# Patient Record
Sex: Male | Born: 1990 | Race: Black or African American | Hispanic: No | Marital: Single | Smoking: Current every day smoker
Health system: Southern US, Community
[De-identification: ages and names within clinical notes are randomized; demographics above are authoritative.]

## PROBLEM LIST (undated history)

## (undated) HISTORY — PX: SHOULDER SURGERY: SHX246

---
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
## 2009-07-10 ENCOUNTER — Emergency Department (HOSPITAL_COMMUNITY): Admission: EM | Admit: 2009-07-10 | Discharge: 2009-07-10 | Payer: Self-pay | Admitting: Emergency Medicine

## 2011-09-25 ENCOUNTER — Encounter (HOSPITAL_COMMUNITY): Payer: Self-pay | Admitting: Emergency Medicine

## 2011-09-25 ENCOUNTER — Emergency Department (HOSPITAL_COMMUNITY)
Admission: EM | Admit: 2011-09-25 | Discharge: 2011-09-25 | Disposition: A | Discharge status: Home Health | Payer: 59 | Attending: Emergency Medicine | Admitting: Emergency Medicine

## 2011-09-25 ENCOUNTER — Emergency Department (HOSPITAL_COMMUNITY): Payer: 59

## 2011-09-25 DIAGNOSIS — R109 Unspecified abdominal pain: Secondary | ICD-10-CM | POA: Insufficient documentation

## 2011-09-25 DIAGNOSIS — T148XXA Other injury of unspecified body region, initial encounter: Secondary | ICD-10-CM

## 2011-09-25 DIAGNOSIS — IMO0002 Reserved for concepts with insufficient information to code with codable children: Secondary | ICD-10-CM | POA: Insufficient documentation

## 2011-09-25 DIAGNOSIS — R220 Localized swelling, mass and lump, head: Secondary | ICD-10-CM | POA: Insufficient documentation

## 2011-09-25 DIAGNOSIS — F17200 Nicotine dependence, unspecified, uncomplicated: Secondary | ICD-10-CM | POA: Insufficient documentation

## 2011-09-25 DIAGNOSIS — S060X9A Concussion with loss of consciousness of unspecified duration, initial encounter: Secondary | ICD-10-CM | POA: Insufficient documentation

## 2011-09-25 MED ORDER — ONDANSETRON HCL 4 MG PO TABS: 4.0000 mg | ORAL_TABLET | Freq: Four times a day (QID) | ORAL | Status: AC

## 2011-09-25 MED ORDER — NAPROXEN 250 MG PO TABS
500.0000 mg | ORAL_TABLET | ORAL | Status: DC
  Filled 2011-09-25: qty 2

## 2011-09-25 MED ORDER — TETANUS-DIPHTH-ACELL PERTUSSIS 5-2.5-18.5 LF-MCG/0.5 IM SUSP
0.5000 mL | Freq: Once | INTRAMUSCULAR | Status: AC
  Administered 2011-09-25: 0.5 mL via INTRAMUSCULAR
  Filled 2011-09-25 (×2): qty 0.5

## 2011-09-25 MED ORDER — ONDANSETRON 4 MG PO TBDP
8.0000 mg | ORAL_TABLET | Freq: Once | ORAL | Status: AC
  Administered 2011-09-25: 8 mg via ORAL
  Filled 2011-09-25: qty 2

## 2011-09-25 MED ORDER — NAPROXEN 500 MG PO TABS: 500.0000 mg | ORAL_TABLET | Freq: Two times a day (BID) | ORAL | Status: AC
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
## 2011-09-25 NOTE — Discharge Instructions (Signed)
Concussion and Brain Injury A blow or jolt to the head can disrupt the normal function of the brain. This type of brain injury is often called a "concussion" or a "closed head injury." Concussions are usually not life-threatening. Even so, the effects of a concussion can be serious.  CAUSES  A concussion is caused by a blunt blow to the head. The blow might be direct or indirect as described below.  Direct blow (running into another player during a soccer game, being hit in a fight, or hitting your head on a hard surface).   Indirect blow (when your head moves rapidly and violently back and forth like in a car crash).  SYMPTOMS  The brain is very complex. Every head injury is different. Some symptoms may appear right away. Other symptoms may not show up for days or weeks after the concussion. The signs of concussion can be hard to notice. Early on, problems may be missed by patients, family members, and caregivers. You may look fine even though you are acting or feeling differently.  These symptoms are usually temporary, but may last for days, weeks, or even longer. Symptoms include:  Mild headaches that will not go away.   Having more trouble than usual with:   Remembering things.   Paying attention or concentrating.   Organizing daily tasks.   Making decisions and solving problems.   Slowness in thinking, acting, speaking, or reading.   Getting lost or easily confused.   Feeling tired all the time or lacking energy (fatigue).   Feeling drowsy.   Sleep disturbances.   Sleeping more than usual.   Sleeping less than usual.   Trouble falling asleep.   Trouble sleeping (insomnia).   Loss of balance or feeling lightheaded or dizzy.   Nausea or vomiting.   Numbness or tingling.   Increased sensitivity to:   Sounds.   Lights.   Distractions.  Other symptoms might include:  Vision problems or eyes that tire easily.   Diminished sense of taste or smell.   Ringing  in the ears.   Mood changes such as feeling sad, anxious, or listless.   Becoming easily irritated or angry for little or no reason.   Lack of motivation.  DIAGNOSIS  Your caregiver can usually diagnose a concussion or mild brain injury based on your description of your injury and your symptoms.  Your evaluation might include:  A brain scan to look for signs of injury to the brain. Even if the test shows no injury, you may still have a concussion.   Blood tests to be sure other problems are not present.  TREATMENT   People with a concussion need to be examined and evaluated. Most people with concussions are treated in an emergency department, urgent care, or clinic. Some people must stay in the hospital overnight for further treatment.   Your caregiver will send you home with important instructions to follow. Be sure to carefully follow them.   Tell your caregiver if you are already taking any medicines (prescription, over-the-counter, or natural remedies), or if you are drinking alcohol or taking illegal drugs. Also, talk with your caregiver if you are taking blood thinners (anticoagulants) or aspirin. These drugs may increase your chances of complications. All of this is important information that may affect treatment.   Only take over-the-counter or prescription medicines for pain, discomfort, or fever as directed by your caregiver.  PROGNOSIS  How fast people recover from brain injury varies from person to person.   Although most people have a good recovery, how quickly they improve depends on many factors. These factors include how severe their concussion was, what part of the brain was injured, their age, and how healthy they were before the concussion.  Because all head injuries are different, so is recovery. Most people with mild injuries recover fully. Recovery can take time. In general, recovery is slower in older persons. Also, persons who have had a concussion in the past or have  other medical problems may find that it takes longer to recover from their current injury. Anxiety and depression may also make it harder to adjust to the symptoms of brain injury. HOME CARE INSTRUCTIONS  Return to your normal activities slowly, not all at once. You must give your body and brain enough time for recovery.  Get plenty of sleep at night, and rest during the day. Rest helps the brain to heal.   Avoid staying up late at night.   Keep the same bedtime hours on weekends and weekdays.   Take daytime naps or rest breaks when you feel tired.   Limit activities that require a lot of thought or concentration (brain or cognitive rest). This includes:   Homework or job-related work.   Watching TV.   Computer work.   Avoid activities that could lead to a second brain injury, such as contact or recreational sports, until your caregiver says it is okay. Even after your brain injury has healed, you should protect yourself from having another concussion.   Ask your caregiver when you can return to your normal activities such as driving, bicycling, or operating heavy equipment. Your ability to react may be slower after a brain injury.   Talk with your caregiver about when you can return to work or school.   Inform your teachers, school nurse, school counselor, coach, athletic trainer, or work manager about your injury, symptoms, and restrictions. They should be instructed to report:   Increased problems with attention or concentration.   Increased problems remembering or learning new information.   Increased time needed to complete tasks or assignments.   Increased irritability or decreased ability to cope with stress.   Increased symptoms.   Take only those medicines that your caregiver has approved.   Do not drink alcohol until your caregiver says you are well enough to do so. Alcohol and certain other drugs may slow your recovery and can put you at risk of further injury.    If it is harder than usual to remember things, write them down.   If you are easily distracted, try to do one thing at a time. For example, do not try to watch TV while fixing dinner.   Talk with family members or close friends when making important decisions.   Keep all follow-up appointments. Repeated evaluation of your symptoms is recommended for your recovery.  PREVENTION  Protect your head from future injury. It is very important to avoid another head or brain injury before you have recovered. In rare cases, another injury has lead to permanent brain damage, brain swelling, or death. Avoid injuries by using:  Seatbelts when riding in a car.   Alcohol only in moderation.   A helmet when biking, skiing, skateboarding, skating, or doing similar activities.   Safety measures in your home.   Remove clutter and tripping hazards from floors and stairways.   Use grab bars in bathrooms and handrails by stairs.   Place non-slip mats on floors and in bathtubs.     Improve lighting in dim areas.  SEEK MEDICAL CARE IF:  A head injury can cause lingering symptoms. You should seek medical care if you have any of the following symptoms for more than 3 weeks after your injury or are planning to return to sports:  Chronic headaches.   Dizziness or balance problems.   Nausea.   Vision problems.   Increased sensitivity to noise or light.   Depression or mood swings.   Anxiety or irritability.   Memory problems.   Difficulty concentrating or paying attention.   Sleep problems.   Feeling tired all the time.  SEEK IMMEDIATE MEDICAL CARE IF:  You have had a blow or jolt to the head and you (or your family or friends) notice:  Severe or worsening headaches.   Weakness (even if only in one hand or one leg or one part of the face), numbness, or decreased coordination.   Repeated vomiting.   Increased sleepiness or passing out.   One black center of the eye (pupil) is larger  than the other.   Convulsions (seizures).   Slurred speech.   Increasing confusion, restlessness, agitation, or irritability.   Lack of ability to recognize people or places.   Neck pain.   Difficulty being awakened.   Unusual behavior changes.   Loss of consciousness.  Older adults with a brain injury may have a higher risk of serious complications such as a blood clot on the brain. Headaches that get worse or an increase in confusion are signs of this complication. If these signs occur, see a caregiver right away. MAKE SURE YOU:   Understand these instructions.   Will watch your condition.   Will get help right away if you are not doing well or get worse.  FOR MORE INFORMATION  Several groups help people with brain injury and their families. They provide information and put people in touch with local resources. These include support groups, rehabilitation services, and a variety of health care professionals. Among these groups, the Brain Injury Association (BIA, www.biausa.org) has a national office that gathers scientific and educational information and works on a national level to help people with brain injury.  Document Released: 09/26/2003 Document Revised: 06/25/2011 Document Reviewed: 02/22/2008 Exit[employer] Patient Information 2012 [employer]. 

## 2011-09-25 NOTE — ED Notes (Signed)
Patient refused medication, sts it is only ibuprofen.
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
## 2013-02-12 ENCOUNTER — Emergency Department (HOSPITAL_COMMUNITY): Payer: 59

## 2013-02-12 ENCOUNTER — Observation Stay (HOSPITAL_COMMUNITY)
Admission: EM | Admit: 2013-02-12 | Discharge: 2013-02-13 | Disposition: A | Discharge status: Home / Self Care | Payer: 59 | Attending: General Surgery | Admitting: General Surgery

## 2013-02-12 ENCOUNTER — Encounter (HOSPITAL_COMMUNITY): Payer: Self-pay | Admitting: *Deleted

## 2013-02-12 DIAGNOSIS — S0081XA Abrasion of other part of head, initial encounter: Secondary | ICD-10-CM

## 2013-02-12 DIAGNOSIS — S060X9A Concussion with loss of consciousness of unspecified duration, initial encounter: Secondary | ICD-10-CM

## 2013-02-12 DIAGNOSIS — S42009A Fracture of unspecified part of unspecified clavicle, initial encounter for closed fracture: Secondary | ICD-10-CM | POA: Insufficient documentation

## 2013-02-12 DIAGNOSIS — S42002A Fracture of unspecified part of left clavicle, initial encounter for closed fracture: Secondary | ICD-10-CM

## 2013-02-12 DIAGNOSIS — IMO0002 Reserved for concepts with insufficient information to code with codable children: Secondary | ICD-10-CM | POA: Insufficient documentation

## 2013-02-12 DIAGNOSIS — S060X0A Concussion without loss of consciousness, initial encounter: Principal | ICD-10-CM | POA: Insufficient documentation

## 2013-02-12 LAB — POCT I-STAT, CHEM 8
BUN: 10 mg/dL (ref 6–23)
Calcium, Ion: 1.18 mmol/L (ref 1.12–1.23)
Chloride: 99 mEq/L (ref 96–112)
Creatinine, Ser: 1.2 mg/dL (ref 0.50–1.35)
Glucose, Bld: 132 mg/dL — ABNORMAL HIGH (ref 70–99)
HCT: 49 % (ref 39.0–52.0)
Hemoglobin: 16.7 g/dL (ref 13.0–17.0)
Potassium: 3.6 mEq/L (ref 3.5–5.1)
Sodium: 139 mEq/L (ref 135–145)
TCO2: 27 mmol/L (ref 0–100)

## 2013-02-12 LAB — BASIC METABOLIC PANEL WITH GFR
BUN: 10 mg/dL (ref 6–23)
Chloride: 99 meq/L (ref 96–112)
Creatinine, Ser: 1.13 mg/dL (ref 0.50–1.35)
Glucose, Bld: 130 mg/dL — ABNORMAL HIGH (ref 70–99)
Potassium: 3.6 meq/L (ref 3.5–5.1)

## 2013-02-12 LAB — PROTIME-INR
INR: 1.05 (ref 0.00–1.49)
Prothrombin Time: 13.5 seconds (ref 11.6–15.2)

## 2013-02-12 LAB — BASIC METABOLIC PANEL
CO2: 31 mEq/L (ref 19–32)
Calcium: 9.8 mg/dL (ref 8.4–10.5)
GFR calc Af Amer: >90 mL/min (ref >90)
GFR calc non Af Amer: >90 mL/min (ref >90)
Sodium: 137 mEq/L (ref 135–145)

## 2013-02-12 LAB — CBC WITH DIFFERENTIAL/PLATELET
Basophils Absolute: 0 10*3/uL (ref 0.0–0.1)
Basophils Relative: 0 % (ref 0–1)
Eosinophils Absolute: 0.2 10*3/uL (ref 0.0–0.7)
Eosinophils Relative: 2 % (ref 0–5)
HCT: 43.8 % (ref 39.0–52.0)
Hemoglobin: 15.2 g/dL (ref 13.0–17.0)
Lymphocytes Relative: 16 % (ref 12–46)
Lymphs Abs: 2.1 K/uL (ref 0.7–4.0)
MCH: 28.4 pg (ref 26.0–34.0)
MCHC: 34.7 g/dL (ref 30.0–36.0)
MCV: 81.7 fL (ref 78.0–100.0)
Monocytes Absolute: 0.7 K/uL (ref 0.1–1.0)
Monocytes Relative: 5 % (ref 3–12)
Neutro Abs: 10.5 K/uL — ABNORMAL HIGH (ref 1.7–7.7)
Neutrophils Relative %: 78 % — ABNORMAL HIGH (ref 43–77)
Platelets: 206 10*3/uL (ref 150–400)
RBC: 5.36 MIL/uL (ref 4.22–5.81)
RDW: 13.4 % (ref 11.5–15.5)
WBC: 13.6 10*3/uL — ABNORMAL HIGH (ref 4.0–10.5)

## 2013-02-12 LAB — RAPID URINE DRUG SCREEN, HOSP PERFORMED
Amphetamines: NOT DETECTED
Barbiturates: NOT DETECTED
Benzodiazepines: NOT DETECTED
Cocaine: NOT DETECTED
Opiates: NOT DETECTED
Tetrahydrocannabinol: NOT DETECTED

## 2013-02-12 LAB — ETHANOL: Alcohol, Ethyl (B): <11 mg/dL (ref 0–11)

## 2013-02-12 LAB — APTT: aPTT: 25 seconds (ref 24–37)

## 2013-02-12 MED ORDER — MORPHINE SULFATE 2 MG/ML IJ SOLN
2.0000 mg | Freq: Once | INTRAMUSCULAR | Status: AC
  Administered 2013-02-12: 2 mg via INTRAVENOUS
  Filled 2013-02-12: qty 1

## 2013-02-12 MED ORDER — ONDANSETRON HCL 4 MG/2ML IJ SOLN: 4.0000 mg | Freq: Three times a day (TID) | INTRAMUSCULAR | Status: DC | PRN

## 2013-02-12 MED ORDER — HYDROCODONE-ACETAMINOPHEN 5-325 MG PO TABS: 1.0000 | ORAL_TABLET | ORAL | Status: DC | PRN

## 2013-02-12 MED ORDER — SODIUM CHLORIDE 0.9 % IV BOLUS (SEPSIS)
1000.0000 mL | Freq: Once | INTRAVENOUS | Status: AC
  Administered 2013-02-12: 1000 mL via INTRAVENOUS
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
## 2013-02-12 NOTE — ED Notes (Signed)
Pt offered pain medication, pt states that he does not need medication currently will inform RN (me) if his pain increases.

## 2013-02-12 NOTE — ED Notes (Signed)
Pt provided urine sample prior to medication administration.
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
## 2013-02-12 NOTE — ED Notes (Signed)
Family at beside. Family given emotional support. 

## 2013-02-12 NOTE — ED Notes (Addendum)
Resident at bedside discussing plan of care with pt and family. NT at bedside performing wound care to abrasion to left upper lip, cleaned bacitracin applied.
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
## 2013-02-13 ENCOUNTER — Encounter (HOSPITAL_COMMUNITY): Payer: Self-pay | Admitting: Radiology

## 2013-02-13 ENCOUNTER — Encounter (INDEPENDENT_AMBULATORY_CARE_PROVIDER_SITE_OTHER): Payer: Self-pay | Admitting: Orthopedic Surgery

## 2013-02-13 ENCOUNTER — Observation Stay (HOSPITAL_COMMUNITY): Payer: 59

## 2013-02-13 DIAGNOSIS — S42009A Fracture of unspecified part of unspecified clavicle, initial encounter for closed fracture: Secondary | ICD-10-CM

## 2013-02-13 DIAGNOSIS — S060XAA Concussion with loss of consciousness status unknown, initial encounter: Secondary | ICD-10-CM

## 2013-02-13 DIAGNOSIS — S060X9A Concussion with loss of consciousness of unspecified duration, initial encounter: Secondary | ICD-10-CM

## 2013-02-13 LAB — BASIC METABOLIC PANEL
BUN: 10 mg/dL (ref 6–23)
CO2: 25 mEq/L (ref 19–32)
Chloride: 106 mEq/L (ref 96–112)
Glucose, Bld: 83 mg/dL (ref 70–99)
Potassium: 3.5 mEq/L (ref 3.5–5.1)

## 2013-02-13 LAB — CBC
HCT: 40.1 % (ref 39.0–52.0)
Hemoglobin: 13.7 g/dL (ref 13.0–17.0)
RBC: 4.87 MIL/uL (ref 4.22–5.81)
WBC: 11.5 10*3/uL — ABNORMAL HIGH (ref 4.0–10.5)

## 2013-02-13 MED ORDER — ONDANSETRON HCL 4 MG/2ML IJ SOLN: 4.0000 mg | Freq: Four times a day (QID) | INTRAMUSCULAR | Status: DC | PRN

## 2013-02-13 MED ORDER — MORPHINE SULFATE 2 MG/ML IJ SOLN
2.0000 mg | INTRAMUSCULAR | Status: DC | PRN
  Administered 2013-02-13: 2 mg via INTRAVENOUS
  Filled 2013-02-13: qty 1

## 2013-02-13 MED ORDER — ONDANSETRON HCL 4 MG/2ML IJ SOLN: 4.0000 mg | INTRAMUSCULAR | Status: DC | PRN

## 2013-02-13 MED ORDER — IOHEXOL 300 MG/ML  SOLN
100.0000 mL | Freq: Once | INTRAMUSCULAR | Status: AC | PRN
  Administered 2013-02-13: 100 mL via INTRAVENOUS

## 2013-02-13 MED ORDER — SODIUM CHLORIDE 0.9 % IV SOLN
INTRAVENOUS | Status: DC
  Administered 2013-02-13: 01:00:00 via INTRAVENOUS

## 2013-02-13 MED ORDER — MORPHINE SULFATE 2 MG/ML IJ SOLN
2.0000 mg | INTRAMUSCULAR | Status: DC | PRN
  Administered 2013-02-13 (×3): 4 mg via INTRAVENOUS
  Filled 2013-02-13 (×3): qty 2

## 2013-02-13 MED ORDER — WHITE PETROLATUM GEL
Status: AC
  Administered 2013-02-13: 05:00:00
  Filled 2013-02-13: qty 5

## 2013-02-13 MED ORDER — ONDANSETRON HCL 4 MG PO TABS: 4.0000 mg | ORAL_TABLET | Freq: Four times a day (QID) | ORAL | Status: DC | PRN

## 2013-02-13 MED ORDER — OXYCODONE HCL 5 MG PO TABS
5.0000 mg | ORAL_TABLET | ORAL | Status: DC | PRN
  Administered 2013-02-13: 10 mg via ORAL
  Administered 2013-02-13: 5 mg via ORAL
  Filled 2013-02-13: qty 1
  Filled 2013-02-13: qty 2

## 2013-02-13 MED ORDER — BACITRACIN ZINC 500 UNIT/GM EX OINT
1.0000 "application " | TOPICAL_OINTMENT | Freq: Two times a day (BID) | CUTANEOUS | Status: DC
  Filled 2013-02-13: qty 28.35

## 2013-02-13 MED ORDER — OXYCODONE-ACETAMINOPHEN 5-325 MG PO TABS: 1.0000 | ORAL_TABLET | ORAL | Status: DC | PRN

## 2013-02-13 NOTE — Plan of Care (Signed)
Problem: Phase II Progression Outcomes Goal: Discharge plan established Recommend outpatient OT when appropriate per MD
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
## 2013-02-13 NOTE — Progress Notes (Signed)
This patient has been seen and I agree with the findings and treatment plan.  Dominic Petersen, III, MD, FACS [phone] (pager) [phone] (direct pager) Trauma Surgeon  
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
## 2013-02-13 NOTE — Progress Notes (Signed)
Pt c/o lower abdominal cramping upon arrival to floor. MD notified. Order received for CT abdomen. Will continue to monitor.
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
## 2013-09-02 ENCOUNTER — Encounter (HOSPITAL_COMMUNITY): Payer: Self-pay | Admitting: Emergency Medicine

## 2013-09-02 ENCOUNTER — Emergency Department (HOSPITAL_COMMUNITY)
Admission: EM | Admit: 2013-09-02 | Discharge: 2013-09-02 | Disposition: A | Discharge status: Home / Self Care | Payer: 59 | Source: Home / Self Care | Attending: Family Medicine | Admitting: Family Medicine

## 2013-09-02 DIAGNOSIS — J111 Influenza due to unidentified influenza virus with other respiratory manifestations: Secondary | ICD-10-CM

## 2013-09-02 DIAGNOSIS — R69 Illness, unspecified: Principal | ICD-10-CM

## 2013-09-02 MED ORDER — OSELTAMIVIR PHOSPHATE 75 MG PO CAPS: 75.0000 mg | ORAL_CAPSULE | Freq: Two times a day (BID) | ORAL | Status: DC

## 2013-09-02 MED ORDER — DEXTROMETHORPHAN POLISTIREX 30 MG/5ML PO LQCR: 60.0000 mg | Freq: Two times a day (BID) | ORAL | Status: DC

## 2013-09-02 NOTE — ED Provider Notes (Signed)
CSN: [id number]     Arrival date & time 09/02/13  1222 History   First MD Initiated Contact with Patient 09/02/13 1301     Chief Complaint  Patient presents with  . URI     (Consider location/radiation/quality/duration/timing/severity/associated sxs/prior Treatment) Patient is a 23 y.o. male presenting with URI. The history is provided by the patient.  URI Presenting symptoms: congestion, cough, fatigue, fever and rhinorrhea   Severity:  Moderate Onset quality:  Sudden Duration:  1 day Progression:  Unchanged Chronicity:  New Associated symptoms: myalgias   Risk factors: sick contacts     History reviewed. No pertinent past medical history. Past Surgical History  Procedure Laterality Date  . Shoulder surgery     History reviewed. No pertinent family history. History  Substance Use Topics  . Smoking status: Current Every Day Smoker  . Smokeless tobacco: Not on file  . Alcohol Use: Yes    Review of Systems  Constitutional: Positive for fever, chills, appetite change and fatigue.  HENT: Positive for congestion, postnasal drip and rhinorrhea.   Respiratory: Positive for cough.   Cardiovascular: Negative.   Gastrointestinal: Negative.   Musculoskeletal: Positive for myalgias.      Allergies  Review of patient's allergies indicates no known allergies.  Home Medications   Current Outpatient Rx  Name  Route  Sig  Dispense  Refill  . dextromethorphan (DELSYM) 30 MG/5ML liquid   Oral   Take 10 mLs (60 mg total) by mouth 2 (two) times daily. Prn cough   89 mL   1   . oseltamivir (TAMIFLU) 75 MG capsule   Oral   Take 1 capsule (75 mg total) by mouth every 12 (twelve) hours. Take all of medication.   10 capsule   0   . oxyCODONE-acetaminophen (ROXICET) 5-325 MG per tablet   Oral   Take 1-2 tablets by mouth every 4 (four) hours as needed for pain.   60 tablet   0    BP 125/74  Pulse 78  Temp(Src) 97.8 F (36.6 C) (Oral)  Resp 18  SpO2 98% Physical Exam   Nursing note and vitals reviewed. Constitutional: He is oriented to person, place, and time. He appears well-developed and well-nourished. No distress.  HENT:  Head: Normocephalic.  Right Ear: External ear normal.  Left Ear: External ear normal.  Nose: Mucosal edema and rhinorrhea present.  Mouth/Throat: Oropharynx is clear and moist. No oropharyngeal exudate.  Neck: Normal range of motion. Neck supple.  Cardiovascular: Regular rhythm.   Pulmonary/Chest: Effort normal and breath sounds normal.  Abdominal: Soft. Bowel sounds are normal.  Lymphadenopathy:    He has no cervical adenopathy.  Neurological: He is alert and oriented to person, place, and time.  Skin: Skin is warm and dry.    ED Course  Procedures (including critical care time) Labs Review Labs Reviewed - No data to display Imaging Review No results found.    MDM   Final diagnoses:  Influenza-like illness        Linna HoffJames D Dakari Cregger, MD 09/02/13 [phone]

## 2013-09-02 NOTE — Discharge Instructions (Signed)
Drink plenty of fluids as discussed, use medicine as prescribed, and mucinex or delsym for cough. No more smoking while sick.  Return or see your doctor if further problems

## 2013-09-02 NOTE — ED Notes (Signed)
Reported general body aches, fatigue, lethargy since yesterday
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
## 2015-12-19 ENCOUNTER — Encounter (HOSPITAL_COMMUNITY): Payer: Self-pay

## 2015-12-19 ENCOUNTER — Encounter (HOSPITAL_COMMUNITY)
Admission: RE | Admit: 2015-12-19 | Discharge: 2015-12-19 | Disposition: A | Discharge status: Home / Self Care | Payer: 59 | Source: Ambulatory Visit | Attending: Surgery | Admitting: Surgery

## 2015-12-19 DIAGNOSIS — Z01812 Encounter for preprocedural laboratory examination: Secondary | ICD-10-CM | POA: Insufficient documentation

## 2015-12-19 DIAGNOSIS — K4090 Unilateral inguinal hernia, without obstruction or gangrene, not specified as recurrent: Secondary | ICD-10-CM | POA: Insufficient documentation

## 2015-12-19 LAB — CBC
HCT: 45.9 % (ref 39.0–52.0)
Hemoglobin: 15 g/dL (ref 13.0–17.0)
MCH: 26.9 pg (ref 26.0–34.0)
MCHC: 32.7 g/dL (ref 30.0–36.0)
MCV: 82.4 fL (ref 78.0–100.0)
Platelets: 207 10*3/uL (ref 150–400)
RBC: 5.57 MIL/uL (ref 4.22–5.81)
RDW: 13.6 % (ref 11.5–15.5)
WBC: 8.9 10*3/uL (ref 4.0–10.5)

## 2015-12-19 LAB — BASIC METABOLIC PANEL
ANION GAP: 5 (ref 5–15)
BUN: 10 mg/dL (ref 6–20)
CALCIUM: 9.9 mg/dL (ref 8.9–10.3)
CHLORIDE: 103 mmol/L (ref 101–111)
CO2: 30 mmol/L (ref 22–32)
CREATININE: 1.04 mg/dL (ref 0.61–1.24)
GFR calc non Af Amer: >60 mL/min (ref >60)
Glucose, Bld: 99 mg/dL (ref 65–99)
Potassium: 4.6 mmol/L (ref 3.5–5.1)
SODIUM: 138 mmol/L (ref 135–145)

## 2015-12-19 MED ORDER — CHLORHEXIDINE GLUCONATE 4 % EX LIQD: 1.0000 "application " | Freq: Once | CUTANEOUS | Status: DC

## 2015-12-19 NOTE — H&P (Signed)
Dominic Petersen is an 25 y.o. male.   Chief Complaint: LIH HPI: pt presents for repair of LIH.  Pt has pain and bulge in left inguinal region  No past medical history on file.  Past Surgical History  Procedure Laterality Date  . Shoulder surgery      No family history on file. Social History:  reports that he has been smoking.  He does not have any smokeless tobacco history on file. He reports that he drinks alcohol. He reports that he does not use illicit drugs.  Allergies: No Known Allergies  No prescriptions prior to admission    No results found for this or any previous visit (from the past 48 hour(s)). No results found.  Review of Systems  Constitutional: Negative.   Respiratory: Negative.   Cardiovascular: Negative.   Gastrointestinal: Negative.   Genitourinary: Negative.   Musculoskeletal: Negative.     There were no vitals taken for this visit. Physical Exam  Constitutional: He is oriented to person, place, and time. He appears well-developed and well-nourished.  HENT:  Head: Normocephalic.  Eyes: Pupils are equal, round, and reactive to light.  Neck: Normal range of motion.  Cardiovascular: Normal rate and regular rhythm.   Respiratory: Effort normal and breath sounds normal.  GI: A hernia is present. Hernia confirmed positive in the left inguinal area.  Musculoskeletal: Normal range of motion.  Neurological: He is alert and oriented to person, place, and time.  Skin: Skin is warm and dry.     Assessment/Plan Left inguinal hernia reducible   Recommend repair of LIH Discussed options of repair and use of mesh  Open and laparoscopic options discussed  The risk of hernia repair include bleeding,  Infection,   Recurrence of the hernia,  Mesh use, chronic pain,  Organ injury,  Bowel injury,  Bladder injury,   nerve injury with numbness around the incision,  Death,  and worsening of preexisting  medical problems.  The alternatives to surgery have been  discussed as well..  Long term expectations of both operative and non operative treatments have been discussed.   The patient agrees to proceed.  Dominic Petersen A., MD 12/19/2015, 10:03 AM

## 2015-12-19 NOTE — Pre-Procedure Instructions (Signed)
    Despina Poleicholas S Rash  12/19/2015      [facility] [address]- [place], [place] - [address] [place] [address] Phone: [phone] Fax: [phone]    Your procedure is scheduled on December 26, 2015 @ 8:30 AM  [facility] at 6:30 A.M.  Call this number if you have problems the morning of surgery:  [phone]   For questions prior to day of surgery call [phone] 8a-4p.   Remember:  Do not eat food or drink liquids after midnight.  Take these medicines the morning of surgery with A SIP OF WATER : NONE   STOP NSAID'S (IBUPROFEN, ADVIL, ALEVE,NAPROXEN) AS OF TODAY   Do not wear jewelry.  Do not wear lotions, powders, or colognes.  You may wear deodorant.  Men may shave face and neck only.  Do not bring valuables to the hospital.  [facility] is not responsible for any belongings or valuables.  Contacts, dentures or bridgework may not be worn into surgery.  Leave your suitcase in the car.  After surgery it may be brought to your room.  For patients admitted to the hospital, discharge time will be determined by your treatment team.  Patients discharged the day of surgery will not be allowed to drive home.   Name and phone number of your driver:    Special instructions:  "Preparing for Surgery"  Please read over the following fact sheets that you were given. Pain Booklet, Coughing and Deep Breathing and Surgical Site Infection Prevention

## 2015-12-25 MED ORDER — DEXTROSE 5 % IV SOLN
3.0000 g | INTRAVENOUS | Status: AC
  Administered 2015-12-26: 3 g via INTRAVENOUS
  Filled 2015-12-25: qty 3000

## 2015-12-25 NOTE — Anesthesia Preprocedure Evaluation (Signed)
Anesthesia Evaluation  Patient identified by MRN, date of birth, ID band Patient awake    Reviewed: Allergy & Precautions, H&P , NPO status , Patient's Chart, lab work & pertinent test results  History of Anesthesia Complications Negative for: history of anesthetic complications  Airway Mallampati: II  TM Distance: >3 FB Neck ROM: full    Dental no notable dental hx.    Pulmonary neg pulmonary ROS, Current Smoker,    Pulmonary exam normal breath sounds clear to auscultation       Cardiovascular negative cardio ROS Normal cardiovascular exam Rhythm:regular Rate:Normal     Neuro/Psych negative neurological ROS     GI/Hepatic negative GI ROS, Neg liver ROS,   Endo/Other  negative endocrine ROS  Renal/GU negative Renal ROS     Musculoskeletal   Abdominal   Peds  Hematology negative hematology ROS (+)   Anesthesia Other Findings   Reproductive/Obstetrics negative OB ROS                             Anesthesia Physical Anesthesia Plan  ASA: I  Anesthesia Plan: General and Regional   Post-op Pain Management:    Induction: Intravenous  Airway Management Planned: Oral ETT and LMA  Additional Equipment:   Intra-op Plan:   Post-operative Plan:   Informed Consent: I have reviewed the patients History and Physical, chart, labs and discussed the procedure including the risks, benefits and alternatives for the proposed anesthesia with the patient or authorized representative who has indicated his/her understanding and acceptance.   Dental Advisory Given  Plan Discussed with: Anesthesiologist, CRNA and Surgeon  Anesthesia Plan Comments: (Will offer TAP block if open inguinal hernia)        Anesthesia Quick Evaluation

## 2015-12-26 ENCOUNTER — Encounter (HOSPITAL_COMMUNITY): Payer: Self-pay | Admitting: Surgery

## 2015-12-26 ENCOUNTER — Ambulatory Visit (HOSPITAL_COMMUNITY)
Admission: RE | Admit: 2015-12-26 | Discharge: 2015-12-26 | Disposition: A | Discharge status: Home / Self Care | Payer: 59 | Source: Ambulatory Visit | Attending: Surgery | Admitting: Surgery

## 2015-12-26 ENCOUNTER — Encounter (HOSPITAL_COMMUNITY)
Admission: RE | Disposition: A | Discharge status: Home / Self Care | Payer: Self-pay | Source: Ambulatory Visit | Attending: Surgery

## 2015-12-26 ENCOUNTER — Ambulatory Visit (HOSPITAL_COMMUNITY): Payer: 59 | Admitting: Anesthesiology

## 2015-12-26 DIAGNOSIS — Z791 Long term (current) use of non-steroidal anti-inflammatories (NSAID): Secondary | ICD-10-CM | POA: Diagnosis not present

## 2015-12-26 DIAGNOSIS — K4090 Unilateral inguinal hernia, without obstruction or gangrene, not specified as recurrent: Secondary | ICD-10-CM | POA: Insufficient documentation

## 2015-12-26 DIAGNOSIS — F17200 Nicotine dependence, unspecified, uncomplicated: Secondary | ICD-10-CM | POA: Diagnosis not present

## 2015-12-26 HISTORY — PX: INGUINAL HERNIA REPAIR: SHX194

## 2015-12-26 HISTORY — PX: INSERTION OF MESH: SHX5868

## 2015-12-26 SURGERY — REPAIR, HERNIA, INGUINAL, ADULT
Anesthesia: General | Site: Groin | Laterality: Left

## 2015-12-26 MED ORDER — ARTIFICIAL TEARS OP OINT
TOPICAL_OINTMENT | OPHTHALMIC | Status: DC | PRN
  Administered 2015-12-26: 1 via OPHTHALMIC

## 2015-12-26 MED ORDER — HYDROMORPHONE HCL 1 MG/ML IJ SOLN
INTRAMUSCULAR | Status: AC
  Administered 2015-12-26: 0.5 mg via INTRAVENOUS
  Filled 2015-12-26: qty 1

## 2015-12-26 MED ORDER — ONDANSETRON HCL 4 MG/2ML IJ SOLN
INTRAMUSCULAR | Status: DC | PRN
  Administered 2015-12-26: 4 mg via INTRAVENOUS

## 2015-12-26 MED ORDER — BUPIVACAINE-EPINEPHRINE (PF) 0.25% -1:200000 IJ SOLN
INTRAMUSCULAR | Status: AC
  Filled 2015-12-26: qty 30

## 2015-12-26 MED ORDER — ROCURONIUM BROMIDE 50 MG/5ML IV SOLN
INTRAVENOUS | Status: AC
  Filled 2015-12-26: qty 1

## 2015-12-26 MED ORDER — METOCLOPRAMIDE HCL 5 MG/ML IJ SOLN
10.0000 mg | Freq: Once | INTRAMUSCULAR | Status: AC | PRN
  Administered 2015-12-26: 10 mg via INTRAVENOUS

## 2015-12-26 MED ORDER — LIDOCAINE 2% (20 MG/ML) 5 ML SYRINGE
INTRAMUSCULAR | Status: AC
  Filled 2015-12-26: qty 5

## 2015-12-26 MED ORDER — SUGAMMADEX SODIUM 200 MG/2ML IV SOLN
INTRAVENOUS | Status: DC | PRN
  Administered 2015-12-26: 200 mg via INTRAVENOUS

## 2015-12-26 MED ORDER — ROCURONIUM BROMIDE 100 MG/10ML IV SOLN
INTRAVENOUS | Status: DC | PRN
  Administered 2015-12-26: 50 mg via INTRAVENOUS

## 2015-12-26 MED ORDER — ONDANSETRON HCL 4 MG/2ML IJ SOLN
INTRAMUSCULAR | Status: AC
  Filled 2015-12-26: qty 2

## 2015-12-26 MED ORDER — METOCLOPRAMIDE HCL 5 MG/ML IJ SOLN
INTRAMUSCULAR | Status: AC
  Filled 2015-12-26: qty 2

## 2015-12-26 MED ORDER — OXYCODONE-ACETAMINOPHEN 5-325 MG PO TABS
1.0000 | ORAL_TABLET | ORAL | Status: DC | PRN
  Administered 2015-12-26: 2 via ORAL

## 2015-12-26 MED ORDER — 0.9 % SODIUM CHLORIDE (POUR BTL) OPTIME
TOPICAL | Status: DC | PRN
  Administered 2015-12-26: 1000 mL

## 2015-12-26 MED ORDER — OXYCODONE HCL 5 MG PO TABS: 5.0000 mg | ORAL_TABLET | Freq: Once | ORAL | Status: DC | PRN

## 2015-12-26 MED ORDER — OXYCODONE-ACETAMINOPHEN 5-325 MG PO TABS
ORAL_TABLET | ORAL | Status: AC
  Administered 2015-12-26: 2 via ORAL
  Filled 2015-12-26: qty 2

## 2015-12-26 MED ORDER — TRAMADOL HCL 50 MG PO TABS: 50.0000 mg | ORAL_TABLET | Freq: Four times a day (QID) | ORAL | Status: AC | PRN

## 2015-12-26 MED ORDER — OXYCODONE-ACETAMINOPHEN 5-325 MG PO TABS: 1.0000 | ORAL_TABLET | ORAL | Status: AC | PRN

## 2015-12-26 MED ORDER — SUGAMMADEX SODIUM 200 MG/2ML IV SOLN
INTRAVENOUS | Status: AC
  Filled 2015-12-26: qty 2

## 2015-12-26 MED ORDER — MIDAZOLAM HCL 2 MG/2ML IJ SOLN
INTRAMUSCULAR | Status: DC | PRN
  Administered 2015-12-26: 2 mg via INTRAVENOUS

## 2015-12-26 MED ORDER — GLYCOPYRROLATE 0.2 MG/ML IJ SOLN
INTRAMUSCULAR | Status: DC | PRN
  Administered 2015-12-26: 0.1 mg via INTRAVENOUS

## 2015-12-26 MED ORDER — GLYCOPYRROLATE 0.2 MG/ML IV SOSY
PREFILLED_SYRINGE | INTRAVENOUS | Status: AC
  Filled 2015-12-26: qty 3

## 2015-12-26 MED ORDER — BUPIVACAINE-EPINEPHRINE 0.25% -1:200000 IJ SOLN
INTRAMUSCULAR | Status: DC | PRN
  Administered 2015-12-26: 20 mL

## 2015-12-26 MED ORDER — ARTIFICIAL TEARS OP OINT
TOPICAL_OINTMENT | OPHTHALMIC | Status: AC
  Filled 2015-12-26: qty 3.5

## 2015-12-26 MED ORDER — LIDOCAINE HCL (CARDIAC) 20 MG/ML IV SOLN
INTRAVENOUS | Status: DC | PRN
  Administered 2015-12-26: 100 mg via INTRATRACHEAL

## 2015-12-26 MED ORDER — PROPOFOL 10 MG/ML IV BOLUS
INTRAVENOUS | Status: DC | PRN
  Administered 2015-12-26: 200 mg via INTRAVENOUS

## 2015-12-26 MED ORDER — CHLORHEXIDINE GLUCONATE 4 % EX LIQD: 1.0000 "application " | Freq: Once | CUTANEOUS | Status: DC

## 2015-12-26 MED ORDER — PROPOFOL 10 MG/ML IV BOLUS
INTRAVENOUS | Status: AC
  Filled 2015-12-26: qty 20

## 2015-12-26 MED ORDER — FENTANYL CITRATE (PF) 250 MCG/5ML IJ SOLN
INTRAMUSCULAR | Status: DC | PRN
  Administered 2015-12-26: 150 ug via INTRAVENOUS
  Administered 2015-12-26 (×2): 50 ug via INTRAVENOUS

## 2015-12-26 MED ORDER — FENTANYL CITRATE (PF) 250 MCG/5ML IJ SOLN
INTRAMUSCULAR | Status: AC
  Filled 2015-12-26: qty 5

## 2015-12-26 MED ORDER — LACTATED RINGERS IV SOLN
INTRAVENOUS | Status: DC | PRN
  Administered 2015-12-26 (×2): via INTRAVENOUS

## 2015-12-26 MED ORDER — OXYCODONE HCL 5 MG/5ML PO SOLN: 5.0000 mg | Freq: Once | ORAL | Status: DC | PRN

## 2015-12-26 MED ORDER — HYDROMORPHONE HCL 1 MG/ML IJ SOLN
0.2500 mg | INTRAMUSCULAR | Status: DC | PRN
  Administered 2015-12-26 (×2): 0.5 mg via INTRAVENOUS

## 2015-12-26 MED ORDER — MIDAZOLAM HCL 2 MG/2ML IJ SOLN
INTRAMUSCULAR | Status: AC
  Filled 2015-12-26: qty 2

## 2015-12-26 SURGICAL SUPPLY — 46 items
BLADE SURG ROTATE 9660 (MISCELLANEOUS) ×3 IMPLANT
CANISTER SUCTION 2500CC (MISCELLANEOUS) IMPLANT
CHLORAPREP W/TINT 26ML (MISCELLANEOUS) ×3 IMPLANT
COVER SURGICAL LIGHT HANDLE (MISCELLANEOUS) ×3 IMPLANT
DRAIN PENROSE 1/2X12 LTX STRL (WOUND CARE) ×3 IMPLANT
DRAPE LAPAROTOMY TRNSV 102X78 (DRAPE) ×3 IMPLANT
DRAPE UTILITY XL STRL (DRAPES) ×3 IMPLANT
ELECT CAUTERY BLADE 6.4 (BLADE) ×3 IMPLANT
ELECT REM PT RETURN 9FT ADLT (ELECTROSURGICAL) ×3
ELECTRODE REM PT RTRN 9FT ADLT (ELECTROSURGICAL) ×1 IMPLANT
GLOVE BIO SURGEON STRL SZ8 (GLOVE) ×6 IMPLANT
GLOVE BIOGEL PI IND STRL 7.5 (GLOVE) ×1 IMPLANT
GLOVE BIOGEL PI IND STRL 8 (GLOVE) ×2 IMPLANT
GLOVE BIOGEL PI INDICATOR 7.5 (GLOVE) ×2
GLOVE BIOGEL PI INDICATOR 8 (GLOVE) ×4
GLOVE ECLIPSE 7.5 STRL STRAW (GLOVE) ×3 IMPLANT
GOWN STRL REUS W/ TWL LRG LVL3 (GOWN DISPOSABLE) ×1 IMPLANT
GOWN STRL REUS W/ TWL XL LVL3 (GOWN DISPOSABLE) ×2 IMPLANT
GOWN STRL REUS W/TWL LRG LVL3 (GOWN DISPOSABLE) ×2
GOWN STRL REUS W/TWL XL LVL3 (GOWN DISPOSABLE) ×4
KIT BASIN OR (CUSTOM PROCEDURE TRAY) ×3 IMPLANT
KIT ROOM TURNOVER OR (KITS) ×3 IMPLANT
LIQUID BAND (GAUZE/BANDAGES/DRESSINGS) ×6 IMPLANT
MESH HERNIA SYS ULTRAPRO LRG (Mesh General) ×3 IMPLANT
NEEDLE HYPO 25GX1X1/2 BEV (NEEDLE) ×3 IMPLANT
NS IRRIG 1000ML POUR BTL (IV SOLUTION) ×3 IMPLANT
PACK SURGICAL SETUP 50X90 (CUSTOM PROCEDURE TRAY) ×3 IMPLANT
PAD ARMBOARD 7.5X6 YLW CONV (MISCELLANEOUS) ×3 IMPLANT
PENCIL BUTTON HOLSTER BLD 10FT (ELECTRODE) ×3 IMPLANT
SPONGE LAP 18X18 X RAY DECT (DISPOSABLE) ×3 IMPLANT
SUT MNCRL AB 4-0 PS2 18 (SUTURE) ×3 IMPLANT
SUT NOVA NAB DX-16 0-1 5-0 T12 (SUTURE) ×6 IMPLANT
SUT SILK 2 0 SH (SUTURE) IMPLANT
SUT VIC AB 0 CT1 27 (SUTURE)
SUT VIC AB 0 CT1 27XBRD ANBCTR (SUTURE) IMPLANT
SUT VIC AB 2-0 SH 27 (SUTURE) ×2
SUT VIC AB 2-0 SH 27X BRD (SUTURE) ×1 IMPLANT
SUT VIC AB 3-0 SH 18 (SUTURE) ×3 IMPLANT
SUT VICRYL AB 3 0 TIES (SUTURE) ×3 IMPLANT
SYR BULB 3OZ (MISCELLANEOUS) ×3 IMPLANT
SYR CONTROL 10ML LL (SYRINGE) ×3 IMPLANT
TOWEL OR 17X24 6PK STRL BLUE (TOWEL DISPOSABLE) ×3 IMPLANT
TOWEL OR 17X26 10 PK STRL BLUE (TOWEL DISPOSABLE) ×3 IMPLANT
TUBE CONNECTING 12'X1/4 (SUCTIONS)
TUBE CONNECTING 12X1/4 (SUCTIONS) IMPLANT
YANKAUER SUCT BULB TIP NO VENT (SUCTIONS) IMPLANT

## 2015-12-26 NOTE — Interval H&P Note (Signed)
History and Physical Interval Note:  12/26/2015 7:03 AM  Dominic Petersen  has presented today for surgery, with the diagnosis of Left inguinal hernia   The various methods of treatment have been discussed with the patient and family. After consideration of risks, benefits and other options for treatment, the patient has consented to  Procedure(s): HERNIA REPAIR LEFT  INGUINAL ADULT WITH MESH (Left) INSERTION OF MESH (Left) as a surgical intervention .  The patient's history has been reviewed, patient examined, no change in status, stable for surgery.  I have reviewed the patient's chart and labs.  Questions were answered to the patient's satisfaction.     Shakeema Lippman A.

## 2015-12-26 NOTE — Op Note (Signed)
Left Inguinal Hernia, Open, Procedure Note with mesh   Indications: The patient presented with a history of a left, reducible inguinal  hernia.  The risk of hernia repair include bleeding,  Infection,   Recurrence of the hernia,  Mesh use, chronic pain,  Organ injury,  Bowel injury,  Bladder injury,   nerve injury with numbness around the incision,  Death,  and worsening of preexisting  medical problems.  The alternatives to surgery have been discussed as well..  Long term expectations of both operative and non operative treatments have been discussed.   The patient agrees to proceed.  Pre-operative Diagnosis: left reducible inguinal hernia   Post-operative Diagnosis: same (indirect)  Surgeon: Harriette BouillonORNETT,Aynsley Fleet A.   Assistants: OR staff   Anesthesia: General endotracheal anesthesia and Local anesthesia 0.25.% bupivacaine, with epinephrine  ASA Class: 1  Procedure Details  The patient was seen again in the Holding Room. The risks, benefits, complications, treatment options, and expected outcomes were discussed with the patient. The possibilities of reaction to medication, pulmonary aspiration, perforation of viscus, bleeding, recurrent infection, the need for additional procedures, and development of a complication requiring transfusion or further operation were discussed with the patient and/or family. There was concurrence with the proposed plan, and informed consent was obtained. The site of surgery was properly noted/marked. The patient was taken to the Operating Room, identified as Dominic Petersen, and the procedure verified as hernia repair. A Time Out was held and the above information confirmed.  The patient was placed in the supine position and underwent induction of anesthesia, the lower abdomen and groin was prepped and draped in the standard fashion, and 0.25% Marcaine with epinephrine was used to anesthetize the skin over the mid-portion of the inguinal canal. A transverse incision was  made. Dissection was carried through the soft tissue to expose the inguinal canal and inguinal ligament along its lower edge. The external oblique fascia was split along the course of its fibers, exposing the inguinal canal. The cord and nerve were looped using a Penrose drain and reflected out of the field. The indirect  defect was exposed,  The hernia sac dissected free and reduced into the preperitoneal cavity and a piece of prolene hernia system ultrapro mesh was and placed into the indirect  defect. Interupted 2-0 novafil suture was then used  to repair the defect, with the suture being sewn from the pubic tubercle inferiorly and superiorly along the canal to a level just beyond the internal ring. The mesh was split to allow passage of the cord and nerve into the canal without entrapment. The contents were then returned to canal and the external oblique fashion was then closed in a continuous fashion using 2-0 Vicryl suture taking care not to cause entrapment. Scarpa's layer closed with 3 0 vicryl and 4 0 monocryl used to close the skin.  Liquid adhesive  used for dressing.  Instrument, sponge, and needle counts were correct prior to closure and at the conclusion of the case.  Findings: Hernia as above  Estimated Blood Loss: Minimal         Drains: None         Total IV Fluids: 800 mL         Specimens: none                Complications: None; patient tolerated the procedure well.         Disposition: PACU - hemodynamically stable.  Condition: stable

## 2015-12-26 NOTE — Anesthesia Procedure Notes (Signed)
Procedure Name: Intubation Date/Time: 12/26/2015 7:37 AM Performed by: Salomon MastWALL, Humbert Morozov COREY Pre-anesthesia Checklist: Patient identified, Emergency Drugs available, Suction available and Patient being monitored Patient Re-evaluated:Patient Re-evaluated prior to inductionOxygen Delivery Method: Circle system utilized Preoxygenation: Pre-oxygenation with 100% oxygen Intubation Type: IV induction Ventilation: Mask ventilation without difficulty Laryngoscope Size: Mac and 3 Grade View: Grade I Tube type: Oral Tube size: 7.0 mm Number of attempts: 1 Airway Equipment and Method: Stylet Placement Confirmation: ETT inserted through vocal cords under direct vision,  positive ETCO2 and breath sounds checked- equal and bilateral Secured at: 24 cm Tube secured with: Tape Dental Injury: Teeth and Oropharynx as per pre-operative assessment

## 2015-12-26 NOTE — Transfer of Care (Signed)
Immediate Anesthesia Transfer of Care Note  Patient: Dominic Petersen  Procedure(s) Performed: Procedure(s): LEFT INGUINAL HERNIA REPAIR (Left) INSERTION OF MESH (Left)  Patient Location: PACU  Anesthesia Type:General  Level of Consciousness: awake, alert , oriented and patient cooperative  Airway & Oxygen Therapy: Patient Spontanous Breathing and Patient connected to nasal cannula oxygen  Post-op Assessment: Report given to RN, Post -op Vital signs reviewed and stable, Patient moving all extremities X 4 and Patient able to stick tongue midline  Post vital signs: Reviewed and stable  Last Vitals:  Filed Vitals:   12/26/15 0616  BP: 122/70  Pulse: 73  Temp: 36.6 C  Resp: 20    Last Pain:  Filed Vitals:   12/26/15 0903  PainSc: 7       Patients Stated Pain Goal: 7 (12/26/15 0630)  Complications: No apparent anesthesia complications

## 2015-12-26 NOTE — Anesthesia Postprocedure Evaluation (Signed)
Anesthesia Post Note  Patient: Dominic Petersen  Procedure(s) Performed: Procedure(s) (LRB): LEFT INGUINAL HERNIA REPAIR (Left) INSERTION OF MESH (Left)  Patient location during evaluation: PACU Anesthesia Type: General Level of consciousness: awake and alert Pain management: pain level controlled Vital Signs Assessment: post-procedure vital signs reviewed and stable Respiratory status: spontaneous breathing, nonlabored ventilation, respiratory function stable and patient connected to nasal cannula oxygen Cardiovascular status: blood pressure returned to baseline and stable Postop Assessment: no signs of nausea or vomiting Anesthetic complications: no    Last Vitals:  Filed Vitals:   12/26/15 0616 12/26/15 0859  BP: 122/70 118/78  Pulse: 73 106  Temp: 36.6 C 36.6 C  Resp: 20 19    Last Pain:  Filed Vitals:   12/26/15 0908  PainSc: 5                  Reino KentJudd, Avishai Reihl J

## 2015-12-26 NOTE — Discharge Instructions (Signed)
[facility] _______[facility]  UMBILICAL OR INGUINAL HERNIA REPAIR: POST OP INSTRUCTIONS  Always review your discharge instruction sheet given to you by the facility where your surgery was performed. IF YOU HAVE DISABILITY OR FAMILY LEAVE FORMS, YOU MUST BRING THEM TO THE OFFICE FOR PROCESSING.   DO NOT GIVE THEM TO YOUR DOCTOR.  1. A  prescription for pain medication may be given to you upon discharge.  Take your pain medication as prescribed, if needed.  If narcotic pain medicine is not needed, then you may take acetaminophen (Tylenol) or ibuprofen (Advil) as needed. 2. Take your usually prescribed medications unless otherwise directed. 3. If you need a refill on your pain medication, please contact your pharmacy.  They will contact our office to request authorization. Prescriptions will not be filled after 5 pm or on week-ends. 4. You should follow a light diet the first 24 hours after arrival home, such as soup and crackers, etc.  Be sure to include lots of fluids daily.  Resume your normal diet the day after surgery. 5. Most patients will experience some swelling and bruising around the umbilicus or in the groin and scrotum.  Ice packs and reclining will help.  Swelling and bruising can take several days to resolve.  6. It is common to experience some constipation if taking pain medication after surgery.  Increasing fluid intake and taking a stool softener (such as Colace) will usually help or prevent this problem from occurring.  A mild laxative (Milk of Magnesia or Miralax) should be taken according to package directions if there are no bowel movements after 48 hours. 7. Unless discharge instructions indicate otherwise, you may remove your bandages 24-48 hours after surgery, and you may shower at that time.  You may have steri-strips (small skin tapes) in place directly over the incision.  These strips should be left on the skin for 7-10 days.  If your surgeon used skin glue on the  incision, you may shower in 24 hours.  The glue will flake off over the next 2-3 weeks.  Any sutures or staples will be removed at the office during your follow-up visit. 8. ACTIVITIES:  You may resume regular (light) daily activities beginning the next day--such as daily self-care, walking, climbing stairs--gradually increasing activities as tolerated.  You may have sexual intercourse when it is comfortable.  Refrain from any heavy lifting or straining until approved by your doctor. a. You may drive when you are no longer taking prescription pain medication, you can comfortably wear a seatbelt, and you can safely maneuver your car and apply brakes. b. RETURN TO WORK:  __________________________________________________________ 9. You should see your doctor in the office for a follow-up appointment approximately 2-3 weeks after your surgery.  Make sure that you call for this appointment within a day or two after you arrive home to insure a convenient appointment time. 10. OTHER INSTRUCTIONS:  __________________________________________________________________________________________________________________________________________________________________________________________  WHEN TO CALL YOUR DOCTOR: 1. Fever over 101.0 2. Inability to urinate 3. Nausea and/or vomiting 4. Extreme swelling or bruising 5. Continued bleeding from incision. 6. Increased pain, redness, or drainage from the incision  The clinic staff is available to answer your questions during regular business hours.  Please don't hesitate to call and ask to speak to one of the nurses for clinical concerns.  If you have a medical emergency, go to the nearest emergency room or call 911.  A surgeon from [facility] is always on call at the hospital     [address], [address], [place], [place]  [address] ?  [address] [address], [place], [place]   [address] [phone] ? [phone] ? FAX [phone] Web site:  [email or web address]  

## 2015-12-27 ENCOUNTER — Encounter (HOSPITAL_COMMUNITY): Payer: Self-pay | Admitting: Surgery

## 2016-01-07 ENCOUNTER — Encounter (HOSPITAL_COMMUNITY): Payer: Self-pay | Admitting: Surgery

## 2019-07-11 ENCOUNTER — Other Ambulatory Visit: Payer: Self-pay

## 2019-07-11 ENCOUNTER — Ambulatory Visit: Payer: HRSA Program | Attending: Internal Medicine

## 2019-07-11 DIAGNOSIS — Z20828 Contact with and (suspected) exposure to other viral communicable diseases: Secondary | ICD-10-CM | POA: Insufficient documentation

## 2019-07-11 DIAGNOSIS — Z20822 Contact with and (suspected) exposure to covid-19: Secondary | ICD-10-CM

## 2019-07-12 LAB — NOVEL CORONAVIRUS, NAA: SARS-CoV-2, NAA: NOT DETECTED
